# Patient Record
Sex: Male | Born: 1980 | Race: Black or African American | Hispanic: No | Marital: Single | Smoking: Never smoker
Health system: Southern US, Community
[De-identification: ages and names within clinical notes are randomized; demographics above are authoritative.]

## PROBLEM LIST (undated history)

## (undated) DIAGNOSIS — R55 Syncope and collapse: Secondary | ICD-10-CM

## (undated) DIAGNOSIS — J45909 Unspecified asthma, uncomplicated: Secondary | ICD-10-CM

## (undated) HISTORY — PX: NO PAST SURGERIES: SHX2092

## (undated) HISTORY — DX: Syncope and collapse: R55

---

## 2019-02-09 ENCOUNTER — Emergency Department (HOSPITAL_COMMUNITY)
Admission: EM | Admit: 2019-02-09 | Discharge: 2019-02-09 | Disposition: A | Discharge status: Home / Self Care | Payer: Self-pay | Attending: Emergency Medicine | Admitting: Emergency Medicine

## 2019-02-09 ENCOUNTER — Emergency Department (HOSPITAL_COMMUNITY): Payer: Self-pay

## 2019-02-09 ENCOUNTER — Encounter (HOSPITAL_COMMUNITY): Payer: Self-pay | Admitting: *Deleted

## 2019-02-09 DIAGNOSIS — J45909 Unspecified asthma, uncomplicated: Secondary | ICD-10-CM | POA: Insufficient documentation

## 2019-02-09 DIAGNOSIS — R0789 Other chest pain: Secondary | ICD-10-CM | POA: Insufficient documentation

## 2019-02-09 HISTORY — DX: Unspecified asthma, uncomplicated: J45.909

## 2019-02-09 LAB — BASIC METABOLIC PANEL
ANION GAP: 9 (ref 5–15)
BUN: 14 mg/dL (ref 6–20)
CO2: 27 mmol/L (ref 22–32)
Calcium: 9.5 mg/dL (ref 8.9–10.3)
Chloride: 107 mmol/L (ref 98–111)
Creatinine, Ser: 1.09 mg/dL (ref 0.61–1.24)
GFR calc non Af Amer: >60 mL/min (ref >60)
Glucose, Bld: 88 mg/dL (ref 70–99)
Potassium: 4.2 mmol/L (ref 3.5–5.1)
Sodium: 143 mmol/L (ref 135–145)

## 2019-02-09 LAB — CBC
HCT: 49.6 % (ref 39.0–52.0)
Hemoglobin: 15 g/dL (ref 13.0–17.0)
MCH: 27.3 pg (ref 26.0–34.0)
MCHC: 30.2 g/dL (ref 30.0–36.0)
MCV: 90.3 fL (ref 80.0–100.0)
Platelets: 254 10*3/uL (ref 150–400)
RBC: 5.49 MIL/uL (ref 4.22–5.81)
RDW: 13 % (ref 11.5–15.5)
WBC: 8.3 10*3/uL (ref 4.0–10.5)
nRBC: 0 % (ref 0.0–0.2)

## 2019-02-09 MED ORDER — NAPROXEN 500 MG PO TABS: 500.0000 mg | ORAL_TABLET | Freq: Two times a day (BID) | ORAL | 0 refills | Status: AC

## 2019-02-09 MED ORDER — SODIUM CHLORIDE 0.9% FLUSH: 3.0000 mL | Freq: Once | INTRAVENOUS | Status: DC

## 2019-02-09 NOTE — ED Triage Notes (Signed)
Pt in c/o shortness of breath, pt states he has a history of asthma, reports his chest feels tight when he is trying to take a deep breath, started this afternoon, denies cough

## 2019-02-09 NOTE — Discharge Instructions (Addendum)
You were evaluated in the Emergency Department and after careful evaluation, we did not find any emergent condition requiring admission or further testing in the hospital.  Your symptoms today seem to be due to sprain or spasm of the muscles between the ribs.  Please use the anti-inflammatory as directed and try to rest these muscles with light duty for a few days.  Please return to the Emergency Department if you experience any worsening of your condition.  We encourage you to follow up with a primary care provider.  Thank you for allowing [place] to be a part of your care.

## 2019-02-09 NOTE — ED Provider Notes (Signed)
[facility] Emergency Department Provider Note MRN:  [id number]  Arrival date & time: 02/09/19     Chief Complaint   Shortness of Breath and Chest Pain   History of Present Illness   Jacob Olsen is a 38 y.o. year-old male with no pertinent past medical history presenting to the ED with chief complaint of chest pain.  At about 3 PM today, while at work, patient experienced sudden onset sharp left-sided chest pain.  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  Pain has been worse with certain positions today.  Denies any associated symptoms such as dizziness, no diaphoresis, no nausea, no vomiting.  Pain is worse with deep breaths.  Pain is worse with palpation.  Pain is moderate.  Review of Systems  A complete 10 system review of systems was obtained and all systems are negative except as noted in the HPI and PMH.   Patient's Health History    Past Medical History:  Diagnosis Date  . Asthma     History reviewed. No pertinent surgical history.  History reviewed. No pertinent family history.  Social History   Socioeconomic History  . Marital status: Single    Spouse name: Not on file  . Number of children: Not on file  . Years of education: Not on file  . Highest education level: Not on file  Occupational History  . Not on file  Social Needs  . Financial resource strain: Not on file  . Food insecurity:    Worry: Not on file    Inability: Not on file  . Transportation needs:    Medical: Not on file    Non-medical: Not on file  Tobacco Use  . Smoking status: Never Smoker  . Smokeless tobacco: Never Used  Substance and Sexual Activity  . Alcohol use: Not on file  . Drug use: Not on file  . Sexual activity: Not on file  Lifestyle  . Physical activity:    Days per week: Not on file    Minutes per session: Not on file  . Stress: Not on file    Relationships  . Social connections:    Talks on phone: Not on file    Gets together: Not on file    Attends religious service: Not on file    Active member of club or organization: Not on file    Attends meetings of clubs or organizations: Not on file    Relationship status: Not on file  . Intimate partner violence:    Fear of current or ex partner: Not on file    Emotionally abused: Not on file    Physically abused: Not on file    Forced sexual activity: Not on file  Other Topics Concern  . Not on file  Social History Narrative  . Not on file     Physical Exam  Vital Signs and Nursing Notes reviewed Vitals:   02/09/19 1700 02/09/19 2023  BP: 124/78 118/66  Pulse: 93 80  Resp: 20 14  Temp: 98.3 F (36.8 C)   SpO2: 99% 99%    CONSTITUTIONAL: Well-appearing, NAD NEURO:  Alert and oriented x 3, no focal deficits EYES:  eyes equal and reactive ENT/NECK:  no LAD, no JVD CARDIO: Regular rate, well-perfused, normal S1 and S2; focal tenderness to palpation to the ribs beneath the left pectoral muscle PULM:  CTAB no wheezing or rhonchi GI/GU:  normal bowel sounds, non-distended, non-tender MSK/SPINE:  No gross deformities, no edema SKIN:  no rash, atraumatic PSYCH:  Appropriate speech and behavior  Diagnostic and Interventional Summary    EKG Interpretation  Date/Time:  Wednesday February 09 2019 16:55:45 EST Ventricular Rate:  98 PR Interval:  182 QRS Duration: 72 QT Interval:  332 QTC Calculation: 423 R Axis:   17 Text Interpretation:  Normal sinus rhythm with sinus arrhythmia Normal ECG Confirmed by Kennis Carina [phone]) on 02/09/2019 9:32:24 PM      Labs Reviewed  BASIC METABOLIC PANEL  CBC    DG Chest 2 View  Final Result      Medications  sodium chloride flush (NS) 0.9 % injection 3 mL (has no administration in time range)     Procedures Critical Care  ED Course and Medical Decision Making  I have reviewed the triage vital signs and the nursing  notes.  Pertinent labs & imaging results that were available during my care of the patient were reviewed by me and considered in my medical decision making (see below for details).  Consistent with musculoskeletal chest pain, PERC negative, no cardiac risk factors, EKG unremarkable, reproducible on exam.  Appropriate for discharge with NSAIDs.  After the discussed management above, the patient was determined to be safe for discharge.  The patient was in agreement with this plan and all questions regarding their care were answered.  ED return precautions were discussed and the patient will return to the ED with any significant worsening of condition.  Elmer Sow. Pilar Plate, MD [facility] Emergency Medicine [facility] [email or web address]  Final Clinical Impressions(s) / ED Diagnoses     ICD-10-CM   1. Chest wall pain R07.89     ED Discharge Orders         Ordered    naproxen (NAPROSYN) 500 MG tablet  2 times daily     02/09/19 2149             Sabas Sous, MD 02/09/19 2150

## 2019-02-09 NOTE — ED Notes (Signed)
Patient verbalizes understanding of discharge instructions. Opportunity for questioning and answers were provided. Armband removed by staff, pt discharged from ED.  

## 2019-03-18 ENCOUNTER — Other Ambulatory Visit: Payer: Self-pay

## 2019-03-18 ENCOUNTER — Encounter (HOSPITAL_COMMUNITY): Payer: Self-pay | Admitting: Emergency Medicine

## 2019-03-18 ENCOUNTER — Emergency Department (HOSPITAL_COMMUNITY)
Admission: EM | Admit: 2019-03-18 | Discharge: 2019-03-18 | Disposition: A | Discharge status: Home / Self Care | Payer: Self-pay | Attending: Emergency Medicine | Admitting: Emergency Medicine

## 2019-03-18 DIAGNOSIS — Z79899 Other long term (current) drug therapy: Secondary | ICD-10-CM | POA: Insufficient documentation

## 2019-03-18 DIAGNOSIS — R55 Syncope and collapse: Secondary | ICD-10-CM | POA: Insufficient documentation

## 2019-03-18 DIAGNOSIS — J45909 Unspecified asthma, uncomplicated: Secondary | ICD-10-CM | POA: Insufficient documentation

## 2019-03-18 LAB — CBC
HCT: 42.9 % (ref 39.0–52.0)
Hemoglobin: 13.8 g/dL (ref 13.0–17.0)
MCH: 28.9 pg (ref 26.0–34.0)
MCHC: 32.2 g/dL (ref 30.0–36.0)
MCV: 89.7 fL (ref 80.0–100.0)
Platelets: 212 10*3/uL (ref 150–400)
RBC: 4.78 MIL/uL (ref 4.22–5.81)
RDW: 13.2 % (ref 11.5–15.5)
WBC: 8.2 10*3/uL (ref 4.0–10.5)
nRBC: 0 % (ref 0.0–0.2)

## 2019-03-18 LAB — URINALYSIS, ROUTINE W REFLEX MICROSCOPIC
Bilirubin Urine: NEGATIVE
Glucose, UA: NEGATIVE mg/dL
HGB URINE DIPSTICK: NEGATIVE
Ketones, ur: NEGATIVE mg/dL
Leukocytes,Ua: NEGATIVE
Nitrite: NEGATIVE
Protein, ur: NEGATIVE mg/dL
Specific Gravity, Urine: 1.015 (ref 1.005–1.030)
pH: 6 (ref 5.0–8.0)

## 2019-03-18 LAB — BASIC METABOLIC PANEL
Anion gap: 8 (ref 5–15)
BUN: 13 mg/dL (ref 6–20)
CO2: 23 mmol/L (ref 22–32)
Calcium: 8.6 mg/dL — ABNORMAL LOW (ref 8.9–10.3)
Chloride: 108 mmol/L (ref 98–111)
Creatinine, Ser: 1.09 mg/dL (ref 0.61–1.24)
GFR calc Af Amer: >60 mL/min (ref >60)
GFR calc non Af Amer: >60 mL/min (ref >60)
Glucose, Bld: 87 mg/dL (ref 70–99)
Potassium: 4.9 mmol/L (ref 3.5–5.1)
Sodium: 139 mmol/L (ref 135–145)

## 2019-03-18 LAB — CBG MONITORING, ED: Glucose-Capillary: 84 mg/dL (ref 70–99)

## 2019-03-18 MED ORDER — SODIUM CHLORIDE 0.9 % IV BOLUS
1000.0000 mL | Freq: Once | INTRAVENOUS | Status: AC
  Administered 2019-03-18: 1000 mL via INTRAVENOUS

## 2019-03-18 NOTE — Discharge Instructions (Signed)
Please follow up with heart specialist for further evaluation of your passing out spell if it reoccur.  Stay hydrated, drink plenty of fluid.
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
## 2019-03-18 NOTE — ED Notes (Signed)
Bed: WA15 Expected date:  Expected time:  Means of arrival:  Comments: EMS syncope 
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
## 2019-03-18 NOTE — ED Notes (Signed)
Patient given discharge teaching and verbalized understanding. Patient ambulated out of ED with a steady gait. 

## 2019-04-12 ENCOUNTER — Telehealth: Payer: Self-pay | Admitting: Cardiovascular Disease

## 2019-04-12 DIAGNOSIS — R55 Syncope and collapse: Secondary | ICD-10-CM | POA: Insufficient documentation

## 2019-04-12 NOTE — Telephone Encounter (Signed)
Spoke with Production designer, theatre/television/film, Mr. Manson Passey, of home patient living in at the time. He consented for patient to have phone visit. Patient does not have access to computer or smart phone at this time.     Virtual Visit Pre-Appointment Phone Call  Steps For Call:  1. Confirm consent - "In the setting of the current Covid19 crisis, you are scheduled for a (phone or video) visit with your provider on (date) at (time).  Just as we do with many in-office visits, in order for you to participate in this visit, we must obtain consent.  If you'd like, I can send this to your mychart (if signed up) or email for you to review.  Otherwise, I can obtain your verbal consent now.  All virtual visits are billed to your insurance company just like a normal visit would be.  By agreeing to a virtual visit, we'd like you to understand that the technology does not allow for your provider to perform an examination, and thus may limit your provider's ability to fully assess your condition.  Finally, though the technology is pretty good, we cannot assure that it will always work on either your or our end, and in the setting of a video visit, we may have to convert it to a phone-only visit.  In either situation, we cannot ensure that we have a secure connection.  Are you willing to proceed?" STAFF: Did the patient verbally acknowledge consent to treatment? Yes  2. Give patient instructions for WebEx/MyChart download to smartphone or Doximity/Doxy.me if video visit (depending on what platform provider is using)  3. Advise patient to be prepared with any vital sign or heart rhythm information, their current medicines, and a piece of paper and pen handy for any instructions they may receive the day of their visit  4. Inform patient they will receive a phone call 15 minutes prior to their appointment time (may be from unknown caller ID) so they should be prepared to answer  5. Confirm that appointment type is correct in Epic appointment  notes (video vs telephone)    TELEPHONE CALL NOTE  CAITLYN KLECKNER has been deemed a candidate for a follow-up tele-health visit to limit community exposure during the Covid-19 pandemic. I spoke with the patient via phone to ensure availability of phone/video source, confirm preferred email & phone number, and discuss instructions and expectations.  I reminded [place] to be prepared with any vital sign and/or heart rhythm information that could potentially be obtained via home monitoring, at the time of his visit. I reminded [place] to expect a phone call at the time of his visit if his visit.  Ethelda Chick, RN 04/12/2019 11:02 AM   DOWNLOADING THE WEBEX SOFTWARE TO SMARTPHONE  - If Apple, go to [employer] and type in WebEx in the search bar. Download [employer], the blue/green circle. The app is free but as with any other app downloads, their phone may require them to verify saved payment information or Apple password. The patient does NOT have to create an account.  - If Android, ask patient to go to [employer] and type in WebEx in the search bar. Download [employer], the blue/green circle. The app is free but as with any other app downloads, their phone may require them to verify saved payment information or Android password. The patient does NOT have to create an account.   CONSENT FOR TELE-HEALTH VISIT - PLEASE REVIEW  I  hereby voluntarily request, consent and authorize [facility] and its employed or contracted physicians, physician assistants, nurse practitioners or other licensed health care professionals (the Practitioner), to provide me with telemedicine health care services (the "Services") as deemed necessary by the treating Practitioner. I acknowledge and consent to receive the Services by the Practitioner via telemedicine. I understand that the telemedicine visit will involve communicating with the Practitioner through live  audiovisual communication technology and the disclosure of certain medical information by electronic transmission. I acknowledge that I have been given the opportunity to request an in-person assessment or other available alternative prior to the telemedicine visit and am voluntarily participating in the telemedicine visit.  I understand that I have the right to withhold or withdraw my consent to the use of telemedicine in the course of my care at any time, without affecting my right to future care or treatment, and that the Practitioner or I may terminate the telemedicine visit at any time. I understand that I have the right to inspect all information obtained and/or recorded in the course of the telemedicine visit and may receive copies of available information for a reasonable fee.  I understand that some of the potential risks of receiving the Services via telemedicine include:  Marland Kitchen. Delay or interruption in medical evaluation due to technological equipment failure or disruption; . Information transmitted may not be sufficient (e.g. poor resolution of images) to allow for appropriate medical decision making by the Practitioner; and/or  . In rare instances, security protocols could fail, causing a breach of personal health information.  Furthermore, I acknowledge that it is my responsibility to provide information about my medical history, conditions and care that is complete and accurate to the best of my ability. I acknowledge that Practitioner's advice, recommendations, and/or decision may be based on factors not within their control, such as incomplete or inaccurate data provided by me or distortions of diagnostic images or specimens that may result from electronic transmissions. I understand that the practice of medicine is not an exact science and that Practitioner makes no warranties or guarantees regarding treatment outcomes. I acknowledge that I will receive a copy of this consent concurrently upon  execution via email to the email address I last provided but may also request a printed copy by calling the office of [facility].    I understand that my insurance will be billed for this visit.   I have read or had this consent read to me. . I understand the contents of this consent, which adequately explains the benefits and risks of the Services being provided via telemedicine.  . I have been provided ample opportunity to ask questions regarding this consent and the Services and have had my questions answered to my satisfaction. . I give my informed consent for the services to be provided through the use of telemedicine in my medical care  By participating in this telemedicine visit I agree to the above.

## 2019-04-12 NOTE — Progress Notes (Signed)
Physician History and Physical     Patient ID: Jacob Olsen MRN: [id number] DOB/AGE: 36/08/1981 36 y.o. Admit date: (Not on file)  Primary Care Physician: Patient, No Pcp Per Primary Cardiologist: /New  Active Problems:   * No active hospital problems. *   Tele- Visit Patient location Home Physician location [place] [facility]  Due to COVID restrictions   HPI:  38 [facility]. [facility] Fayrene Helperran Bowie PA- c for syncope. Reviewed ER note from March 20. 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 No chest pain , dyspnea palpitations No high risk family history In ER r/o , no arrhythmia CXR normal labs ok Given fluid and d/c ? Vasovagal episode ECG normal including QT no signs of Brugada or HOCM He denies excess ETOH, drugs . He has had a few episodes of unexplained LOC in the past year. No history of head trauma, or seizures. Takes no meds routinely PRN naproxen for pain  [facility] [place] 3 kids and [place] Thinks episode was from low sugar and heat had sweater on washing cars and hadn't   COVID screen negative no dyspnea, fever cough or contacts with positive patients   Review of systems complete and found to be negative unless listed above   Past Medical History:  Diagnosis Date  . Asthma   . Syncope and collapse     No family history on file.  Social History   Socioeconomic History  . Marital status: Single    Spouse name: Not on file  . Number of children: Not on file  . Years of education: Not on file  . Highest education level: Not on file  Occupational History  . Not on file  Social Needs  . Financial resource strain: Not on file  . Food insecurity:    Worry: Not on file    Inability: Not on file  . Transportation needs:    Medical: Not on file    Non-medical: Not on file  Tobacco Use  . Smoking status: Never Smoker  . Smokeless tobacco: Never Used  Substance and Sexual Activity  . Alcohol use: Not Currently  .  Drug use: Never  . Sexual activity: Not on file  Lifestyle  . Physical activity:    Days per week: Not on file    Minutes per session: Not on file  . Stress: Not on file  Relationships  . Social connections:    Talks on phone: Not on file    Gets together: Not on file    Attends religious service: Not on file    Active member of club or organization: Not on file    Attends meetings of clubs or organizations: Not on file    Relationship status: Not on file  . Intimate partner violence:    Fear of current or ex partner: Not on file    Emotionally abused: Not on file    Physically abused: Not on file    Forced sexual activity: Not on file  Other Topics Concern  . Not on file  Social History Narrative  . Not on file    No past surgical history on file.   (Not in a hospital admission)   Physical Exam: Height 6\' 1"  (1.854 m), weight 131.5 kg.    130/70 60 afebrile No JVP elevation No tachypnea No edema Skin warm and dry   Current Outpatient Medications on File Prior to Visit  Medication Sig  Dispense Refill  . naproxen (NAPROSYN) 500 MG tablet Take 1 tablet (500 mg total) by mouth 2 (two) times daily. 30 tablet 0   No current facility-administered medications on file prior to visit.     Labs:   Lab Results  Component Value Date   WBC 8.2 03/18/2019   HGB 13.8 03/18/2019   HCT 42.9 03/18/2019   MCV 89.7 03/18/2019   PLT 212 03/18/2019   No results for input(s): NA, K, CL, CO2, BUN, CREATININE, CALCIUM, PROT, BILITOT, ALKPHOS, ALT, AST, GLUCOSE in the last 168 hours.  Invalid input(s): LABALBU No results found for: CKTOTAL, CKMB, CKMBINDEX, TROPONINI  No results found for: CHOL No results found for: HDL No results found for: LDLCALC No results found for: TRIG No results found for: CHOLHDL No results found for: LDLDIRECT     Radiology: No results found.  EKG: NSR normal ECG 03/18/19  ASSESSMENT AND PLAN:  Syncope:  Doubt cardiac in etiology No high risk  family history , normal exam and ECG. Will have him get echo to r/o structural heart disease and ETT to r/o abnormal hemodynamic response to exercise and arrhythmia Primary should arrange f/u with neurology and consider head CT   He understands testing may be delayed due to COVID 19 restrictions    Signed: Theron Arista Nishan4/16/2020, 8:30 AM

## 2019-04-12 NOTE — Telephone Encounter (Signed)
New Message          Patient is coming in on Thursday for Dr. Eden Emms, need records from hospital. [id number]. DOB September 13, 1981 Thx.

## 2019-04-14 ENCOUNTER — Other Ambulatory Visit: Payer: Self-pay

## 2019-04-14 ENCOUNTER — Telehealth (INDEPENDENT_AMBULATORY_CARE_PROVIDER_SITE_OTHER): Payer: Self-pay | Admitting: Cardiovascular Disease

## 2019-04-14 ENCOUNTER — Encounter: Payer: Self-pay | Admitting: Cardiovascular Disease

## 2019-04-14 VITALS — Ht 73.0 in | Wt 290.0 lb

## 2019-04-14 DIAGNOSIS — R55 Syncope and collapse: Secondary | ICD-10-CM

## 2019-04-14 NOTE — Patient Instructions (Addendum)
Medication Instructions:   If you need a refill on your cardiac medications before your next appointment, please call your pharmacy.   Lab work:  If you have labs (blood work) drawn today and your tests are completely normal, you will receive your results only by: . MyChart Message (if you have MyChart) OR . A paper copy in the mail If you have any lab test that is abnormal or we need to change your treatment, we will call you to review the results.  Testing/Procedures: Your physician has requested that you have an echocardiogram. Echocardiography is a painless test that uses sound waves to create images of your heart. It provides your doctor with information about the size and shape of your heart and how well your heart's chambers and valves are working. This procedure takes approximately one hour. There are no restrictions for this procedure.  Your physician has requested that you have an exercise tolerance test. For further information please visit [email or web address]. Please also follow instruction sheet, as given.  Follow-Up: At [facility], you and your health needs are our priority.  As part of our continuing mission to provide you with exceptional heart care, we have created designated Provider Care Teams.  These Care Teams include your primary Cardiologist (physician) and Advanced Practice Providers (APPs -  Physician Assistants and Nurse Practitioners) who all work together to provide you with the care you need, when you need it. . You will need a follow up appointment as needed with Dr. Nishan.    

## 2019-04-15 ENCOUNTER — Ambulatory Visit: Payer: Self-pay | Admitting: Cardiovascular Disease

## 2019-04-23 ENCOUNTER — Other Ambulatory Visit: Payer: Self-pay

## 2019-04-23 ENCOUNTER — Encounter (HOSPITAL_COMMUNITY): Payer: Self-pay | Admitting: *Deleted

## 2019-04-23 ENCOUNTER — Emergency Department (HOSPITAL_COMMUNITY): Payer: Self-pay

## 2019-04-23 ENCOUNTER — Emergency Department (HOSPITAL_COMMUNITY)
Admission: EM | Admit: 2019-04-23 | Discharge: 2019-04-23 | Disposition: A | Discharge status: Home / Self Care | Payer: Self-pay | Attending: Emergency Medicine | Admitting: Emergency Medicine

## 2019-04-23 DIAGNOSIS — Z20822 Contact with and (suspected) exposure to covid-19: Secondary | ICD-10-CM

## 2019-04-23 DIAGNOSIS — R5383 Other fatigue: Secondary | ICD-10-CM | POA: Insufficient documentation

## 2019-04-23 DIAGNOSIS — R6889 Other general symptoms and signs: Secondary | ICD-10-CM

## 2019-04-23 DIAGNOSIS — R0981 Nasal congestion: Secondary | ICD-10-CM | POA: Insufficient documentation

## 2019-04-23 DIAGNOSIS — M7918 Myalgia, other site: Secondary | ICD-10-CM | POA: Insufficient documentation

## 2019-04-23 DIAGNOSIS — Z8709 Personal history of other diseases of the respiratory system: Secondary | ICD-10-CM | POA: Insufficient documentation

## 2019-04-23 DIAGNOSIS — R51 Headache: Secondary | ICD-10-CM | POA: Insufficient documentation

## 2019-04-23 DIAGNOSIS — R05 Cough: Secondary | ICD-10-CM | POA: Insufficient documentation

## 2019-04-23 DIAGNOSIS — R519 Headache, unspecified: Secondary | ICD-10-CM

## 2019-04-23 DIAGNOSIS — H538 Other visual disturbances: Secondary | ICD-10-CM | POA: Insufficient documentation

## 2019-04-23 DIAGNOSIS — J189 Pneumonia, unspecified organism: Secondary | ICD-10-CM | POA: Insufficient documentation

## 2019-04-23 MED ORDER — ACETAMINOPHEN 325 MG PO TABS
650.0000 mg | ORAL_TABLET | Freq: Once | ORAL | Status: AC
  Administered 2019-04-23: 650 mg via ORAL
  Filled 2019-04-23: qty 2

## 2019-04-23 MED ORDER — ALBUTEROL SULFATE HFA 108 (90 BASE) MCG/ACT IN AERS
1.0000 | INHALATION_SPRAY | RESPIRATORY_TRACT | Status: DC | PRN
  Administered 2019-04-23: 2 via RESPIRATORY_TRACT
  Filled 2019-04-23: qty 6.7

## 2019-04-23 MED ORDER — SODIUM CHLORIDE 0.9 % IV BOLUS
1000.0000 mL | Freq: Once | INTRAVENOUS | Status: AC
  Administered 2019-04-23: 1000 mL via INTRAVENOUS

## 2019-04-23 MED ORDER — DOXYCYCLINE HYCLATE 100 MG PO CAPS: 100.0000 mg | ORAL_CAPSULE | Freq: Two times a day (BID) | ORAL | 0 refills | Status: AC

## 2019-04-23 MED ORDER — AEROCHAMBER PLUS FLO-VU LARGE MISC
1.0000 | Freq: Once | Status: AC
  Administered 2019-04-23: 10:00:00 1

## 2019-04-23 NOTE — ED Triage Notes (Signed)
PT reports upon waking this morning he had a HA , muscle fatigue and was weezing.

## 2019-04-23 NOTE — Discharge Instructions (Addendum)
You have been diagnosed today with suspected COVID-19 virus infection, community-acquired pneumonia of both lungs, headache.  At this time there does not appear to be the presence of an emergent medical condition, however there is always the potential for conditions to change. Please read and follow the below instructions.  Please return to the Emergency Department immediately for any new or worsening symptoms. Please be sure to follow up with your Primary Care Provider within one week regarding your visit today; please call their office to schedule an appointment even if you are feeling better for a follow-up visit. You may use the albuterol inhaler and spacer given to you today to help with your symptoms.  Return to the emergency department if you feel that the albuterol is not working or that you are using it too often. Please take the antibiotic medication doxycycline as prescribed to help treat your pneumonia.  Take this medication twice daily for the next 7 days. It is likely that dehydration has contributed to your headache.  Please drink plenty of water to avoid dehydration and get plenty of rest.  Return to emergency department immediately for any new or worsening symptoms including neck pain/stiffness. Please continue to self quarantine for the next 2 weeks to avoid potential spread of virus.  Return to emergency department immediately for any new or worsening symptoms\  Get help right away if: You are short of breath and it gets worse. You have chest pain. You cough up blood. Your headache gets very bad quickly. You throw up. You have a stiff neck or neck pain You have trouble seeing. You have trouble speaking. You have pain in the eye or ear. Your muscles are weak or you lose muscle control. You lose your balance or have trouble walking. You feel like you will pass out (faint) or you pass out. You are mixed up (confused). You have a seizure. Any new/concerning or worsening  symptoms  Please read the additional information packets attached to your discharge summary.  Do not take your medicine if  develop an itchy rash, swelling in your mouth or lips, or difficulty breathing.  =============== If you live with, or provide care at home for, a person confirmed to have, or being evaluated for, COVID-19 infection please follow these guidelines to prevent infection:  Follow healthcare providers instructions Make sure that you understand and can help the patient follow any healthcare provider instructions for all care.  Provide for the patients basic needs You should help the patient with basic needs in the home and provide support for getting groceries, prescriptions, and other personal needs.  Monitor the patients symptoms If they are getting sicker, call his or her medical provider a  This will help the healthcare providers office take steps to keep other people from getting infected. Ask the healthcare provider to call the local or state health department.  Limit the number of people who have contact with the patient If possible, have only one caregiver for the patient. Other household members should stay in another home or place of residence. If this is not possible, they should stay in another room, or be separated from the patient as much as possible. Use a separate bathroom, if available. Restrict visitors who do not have an essential need to be in the home.  Keep older adults, very young children, and other sick people away from the patient Keep older adults, very young children, and those who have compromised immune systems or chronic health conditions away from  the patient. This includes people with chronic heart, lung, or kidney conditions, diabetes, and cancer.  Ensure good ventilation Make sure that shared spaces in the home have good air flow, such as from an air conditioner or an opened window, weather permitting.  Wash your hands  often Wash your hands often and thoroughly with soap and water for at least 20 seconds. You can use an alcohol based hand sanitizer if soap and water are not available and if your hands are not visibly dirty. Avoid touching your eyes, nose, and mouth with unwashed hands. Use disposable paper towels to dry your hands. If not available, use dedicated cloth towels and replace them when they become wet.  Wear a facemask and gloves Wear a disposable facemask at all times in the room and gloves when you touch or have contact with the patients blood, body fluids, and/or secretions or excretions, such as sweat, saliva, sputum, nasal mucus, vomit, urine, or feces.  Ensure the mask fits over your nose and mouth tightly, and do not touch it during use. Throw out disposable facemasks and gloves after using them. Do not reuse. Wash your hands immediately after removing your facemask and gloves. If your personal clothing becomes contaminated, carefully remove clothing and launder. Wash your hands after handling contaminated clothing. Place all used disposable facemasks, gloves, and other waste in a lined container before disposing them with other household waste. Remove gloves and wash your hands immediately after handling these items.  Do not share dishes, glasses, or other household items with the patient Avoid sharing household items. You should not share dishes, drinking glasses, cups, eating utensils, towels, bedding, or other items After the person uses these items, you should wash them thoroughly with soap and water.  Wash laundry thoroughly Immediately remove and wash clothes or bedding that have blood, body fluids, and/or secretions or excretions, such as sweat, saliva, sputum, nasal mucus, vomit, urine, or feces, on them. Wear gloves when handling laundry from the patient. Read and follow directions on labels of laundry or clothing items and detergent. In general, wash and dry with the warmest  temperatures recommended on the label.  Clean all areas the individual has used often Clean all touchable surfaces, such as counters, tabletops, doorknobs, bathroom fixtures, toilets, phones, keyboards, tablets, and bedside tables, every day. Also, clean any surfaces that may have blood, body fluids, and/or secretions or excretions on them. Wear gloves when cleaning surfaces the patient has come in contact with. Use a diluted bleach solution (e.g., dilute bleach with 1 part bleach and 10 parts water) or a household disinfectant with a label that says EPA-registered for coronaviruses. To make a bleach solution at home, add 1 tablespoon of bleach to 1 quart (4 cups) of water. For a larger supply, add  cup of bleach to 1 gallon (16 cups) of water. Read labels of cleaning products and follow recommendations provided on product labels. Labels contain instructions for safe and effective use of the cleaning product including precautions you should take when applying the product, such as wearing gloves or eye protection and making sure you have good ventilation during use of the product. Remove gloves and wash hands immediately after cleaning.  Monitor yourself for signs and symptoms of illness Caregivers and household members are considered close contacts, should monitor their health, and will be asked to limit movement outside of the home to the extent possible. Follow the monitoring steps for close contacts listed on the symptom monitoring form.   ?  If you have additional questions, contact your local health department or call the epidemiologist on call at [phone] (available 24/7). ? This guidance is subject to change. For the most up-to-date guidance from [facility], please refer to their website: TripMetro.huhttps://www.cdc.gov/coronavirus/2019-ncov/hcp/guidance-prevent-spread.html

## 2019-04-23 NOTE — ED Notes (Signed)
Patient transported to CT 

## 2019-04-23 NOTE — ED Provider Notes (Signed)
[facility] EMERGENCY DEPARTMENT Provider Note   CSN: [id number] Arrival date & time: 04/23/19  0740    History   Chief Complaint Chief Complaint  Patient presents with   Headache    HPI Jacob Olsen is a 38 y.o. male with history of asthma presenting today for viral illness.  Patient reports that over the past 2 days he has had subjective fever, productive cough, generalized body aches, fatigue and headache.  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  He has not taken any medications or antipyretics prior to arrival.  Patient reports that all of his symptoms have been worsening since onset 2 days ago.  He describes a cough productive with small amounts of white/yellow sputum.  He describes his headache as a frontal headache constant throbbing mild without aggravating or alleviating factors.  Patient reports that he has had headaches in the past similar in nature to this headache he describes a gradual onset of his headache and denies any history of trauma, sudden onset, pain around the temporal arteries, numbness/weakness or tingling of the extremities or neck pain/stiffness.  Patient does state that he will occasionally see small spots in his vision however these are brief and resolve on their own.  Of note patient was seen on 03/18/2019 for syncope and collapse while at work.  He has followed up via telemedicine with cardiology on 04/14/2019 and they plan to perform stress test and echo for further evaluation.  Patient denies any further syncope or collapse since 03/18/2019.  Further chart review reveals that cardiology was deferring CT head to neurology for further evaluation but patient reports inability to follow-up to receive head CT due to current pandemic.    HPI  Past Medical History:  Diagnosis Date   Asthma    Syncope and collapse     Patient Active Problem List   Diagnosis Date Noted   Syncope and  collapse 04/12/2019    History reviewed. No pertinent surgical history.      Home Medications    Prior to Admission medications   Medication Sig Start Date End Date Taking? Authorizing Provider  doxycycline (VIBRAMYCIN) 100 MG capsule Take 1 capsule (100 mg total) by mouth 2 (two) times daily for 7 days. 04/23/19 04/30/19  Harlene Salts A, PA-C  naproxen (NAPROSYN) 500 MG tablet Take 1 tablet (500 mg total) by mouth 2 (two) times daily. 02/09/19   Sabas Sous, MD    Family History History reviewed. No pertinent family history.  Social History Social History   Tobacco Use   Smoking status: Never Smoker   Smokeless tobacco: Never Used  Substance Use Topics   Alcohol use: Not Currently   Drug use: Never     Allergies   Patient has no known allergies.   Review of Systems Review of Systems  Constitutional: Positive for chills, fatigue and fever (Subjective).  HENT: Positive for congestion and rhinorrhea. Negative for sore throat, trouble swallowing and voice change.   Eyes: Positive for visual disturbance (Occasional seeing spots). Negative for photophobia and pain.  Respiratory: Positive for cough and shortness of breath.   Cardiovascular: Negative.  Negative for chest pain and leg swelling.  Gastrointestinal: Negative.  Negative for abdominal pain, diarrhea, nausea and vomiting.  Musculoskeletal: Positive for arthralgias and myalgias. Negative for neck stiffness.  Neurological: Positive for headaches. Negative for dizziness, syncope, weakness and numbness.  All other systems reviewed and are negative.  Physical  Exam Updated Vital Signs BP 134/78 (BP Location: Right Wrist)    Pulse 100    Temp 99 F (37.2 C) (Oral)    Resp 16    Ht  (1.854 m)    Wt 131.9 kg    SpO2 100%    BMI 38.36 kg/m   Physical Exam Constitutional:      General: He is not in acute distress.    Appearance: Normal appearance. He is well-developed. He is obese. He is not ill-appearing  or diaphoretic.  HENT:     Head: Normocephalic and atraumatic. No raccoon eyes, Battle's sign, abrasion or contusion.     Jaw: There is normal jaw occlusion. No trismus.     Right Ear: Tympanic membrane, ear canal and external ear normal. No hemotympanum.     Left Ear: Tympanic membrane, ear canal and external ear normal. No hemotympanum.     Ears:     Comments: Hearing grossly intact bilaterally    Nose: Nose normal. No rhinorrhea.     Right Nostril: No epistaxis.     Left Nostril: No epistaxis.     Mouth/Throat:     Lips: Pink.     Mouth: Mucous membranes are moist.     Pharynx: Oropharynx is clear. Uvula midline.  Eyes:     General: Vision grossly intact. Gaze aligned appropriately.     Extraocular Movements: Extraocular movements intact.     Conjunctiva/sclera: Conjunctivae normal.     Pupils: Pupils are equal, round, and reactive to light.     Comments: Visual fields grossly intact bilaterally  Neck:     Musculoskeletal: Full passive range of motion without pain, normal range of motion and neck supple. No neck rigidity.     Trachea: Trachea and phonation normal. No tracheal tenderness or tracheal deviation.     Meningeal: Brudzinski's sign absent.  Cardiovascular:     Rate and Rhythm: Normal rate and regular rhythm.     Pulses:          Dorsalis pedis pulses are 2+ on the right side and 2+ on the left side.       Posterior tibial pulses are 2+ on the right side and 2+ on the left side.     Heart sounds: Normal heart sounds.  Pulmonary:     Effort: Pulmonary effort is normal. No accessory muscle usage or respiratory distress.     Breath sounds: Normal air entry. Wheezing (Faint bilateral expiratory wheezing) present.  Chest:     Chest wall: No tenderness.  Abdominal:     General: Bowel sounds are normal. There is no distension.     Palpations: Abdomen is soft.     Tenderness: There is no abdominal tenderness. There is no guarding or rebound.  Musculoskeletal:     Right  lower leg: Normal. He exhibits no tenderness. No edema.     Left lower leg: Normal. He exhibits no tenderness. No edema.     Comments: No midline C/T/L spinal tenderness to palpation, no paraspinal muscle tenderness, no deformity, crepitus, or step-off noted. No sign of injury to the neck or back.  Feet:     Right foot:     Protective Sensation: 5 sites tested. 5 sites sensed.     Left foot:     Protective Sensation: 5 sites tested. 5 sites sensed.  Skin:    General: Skin is warm and dry.  Neurological:     Mental Status: He is alert and oriented to person,  place, and time.     GCS: GCS eye subscore is 4. GCS verbal subscore is 5. GCS motor subscore is 6.     Comments: Mental Status: Alert, oriented, thought content appropriate, able to give a coherent history. Speech fluent without evidence of aphasia. Able to follow 2 step commands without difficulty. Cranial Nerves: II: Peripheral visual fields grossly normal, pupils equal, round, reactive to light III,IV, VI: ptosis not present, extra-ocular motions intact bilaterally V,VII: smile symmetric, eyebrows raise symmetric, facial light touch sensation equal VIII: hearing grossly normal to voice X: uvula elevates symmetrically XI: bilateral shoulder shrug symmetric and strong XII: midline tongue extension without fassiculations Motor: Normal tone. 5/5 strength in upper and lower extremities bilaterally including strong and equal grip strength and dorsiflexion/plantar flexion Sensory: Sensation intact to light touch in all extremities.Negative Romberg.  Deep Tendon Reflexes: 2+ and symmetric patella Cerebellar: normal finger-to-nose maze with bilateral upper extremities. Normal heel-to -shin balance bilaterally of the lower extremity. No pronator drift.  Gait: normal gait and balance CV: distal pulses palpable throughout  Psychiatric:        Mood and Affect: Mood normal.        Behavior: Behavior is cooperative.    ED  Treatments / Results  Labs (all labs ordered are listed, but only abnormal results are displayed) Labs Reviewed - No data to display  EKG None  Radiology Ct Head Wo Contrast  Result Date: 04/23/2019 CLINICAL DATA:  Headache, muscle fatigue and wheezing upon awakening. EXAM: CT HEAD WITHOUT CONTRAST TECHNIQUE: Contiguous axial images were obtained from the base of the skull through the vertex without intravenous contrast. COMPARISON:  None. FINDINGS: Brain: No evidence of acute infarction, hemorrhage, hydrocephalus, extra-axial collection or mass lesion/mass effect. Normal cerebral volume. No white matter disease. Vascular: No hyperdense vessel or unexpected calcification. Skull: Normal. Negative for fracture or focal lesion. Sinuses/Orbits: No acute finding. Mild mucosal thickening both maxillary sinuses. Mild mucosal thickening BILATERAL ethmoid air cells. No acute orbital findings. Other: None. IMPRESSION: Negative exam. Electronically Signed   By: Elsie StainJohn T Curnes M.D.   On: 04/23/2019 09:18   Dg Chest Portable 1 View  Result Date: 04/23/2019 CLINICAL DATA:  Cough without fever. EXAM: PORTABLE CHEST 1 VIEW COMPARISON:  02/09/2019 FINDINGS: Normal heart size. Faint nodular BILATERAL pulmonary opacities could represent edema or infection. No effusion or pneumothorax. Worsening aeration from priors. IMPRESSION: Faint BILATERAL nodular pulmonary opacities could represent edema or infection. Worsening aeration from priors. Electronically Signed   By: Elsie StainJohn T Curnes M.D.   On: 04/23/2019 08:29    Procedures Procedures (including critical care time)  Medications Ordered in ED Medications  AeroChamber Plus Flo-Vu Large MISC 1 each (has no administration in time range)  albuterol (VENTOLIN HFA) 108 (90 Base) MCG/ACT inhaler 1-2 puff (2 puffs Inhalation Given 04/23/19 0940)  sodium chloride 0.9 % bolus 1,000 mL (1,000 mLs Intravenous New Bag/Given 04/23/19 0849)  acetaminophen (TYLENOL) tablet 650 mg  (650 mg Oral Given 04/23/19 [id number])     Initial Impression / Assessment and Plan / ED Course  I have reviewed the triage vital signs and the nursing notes.  Pertinent labs & imaging results that were available during my care of the patient were reviewed by me and considered in my medical decision making (see chart for details).    38 year old male with viral-like illness x2 days with cough, subjective fever, congestion and body aches additionally with headache worsening since this morning.  Reports seeing spots however no neuro deficits on exam.  Chart review reveals that patient has outpatient CT head planned for history of syncope and collapse as outpatient however he has had difficulty scheduling this due to current pandemic.  Discussed with Dr. Pilar Plate attending, will pursue CT head at this time. - On examination patient is tired appearing however no acute distress.  Heart regular rate and rhythm without murmur, not tachycardic on auscultation, lungs with faint bilateral wheezing present, no respiratory distress.  Abdomen soft nontender and without peritoneal signs.  Neuro examination is without deficit.  No meningeal signs on examination. - Chest x-ray:  IMPRESSION:  Faint BILATERAL nodular pulmonary opacities could represent edema or  infection. Worsening aeration from priors.   CT head:  IMPRESSION:  Negative exam.   Case rediscussed with Dr. Pilar Plate.  Suspect dehydration plays a part of patient's headache and seeing spots.  Fluid bolus given, 650 mg Tylenol given with resolution of patient's headache.  He states he is feeling much improved and is requesting discharge.  I have encouraged patient to continue follow-up as previously scheduled with his cardiologist regarding his prior visits.  Discussion held with patient and he is aware of possibility of COVID-19 infection however due to current protocols testing is not indicated at this time and he states understanding.  I have discussed two-week  quarantine of the patient and until he is asymptomatic for 1 week.  Doxycycline 100 mg twice daily x7 days prescribed for suspected community-acquired pneumonia.  He has also been given a refill of his albuterol inhaler and given a spacer here for his mild wheezing.  Patient is borderline tachycardic at 100 bpm this is improved following fluid bolus have encouraged water rehydration.  Respirations at 16 breaths/min and SPO2 100% on room air.  There is no indication for admission of this patient at this time.  I have discussed return precautions at length with the patient including that of COVID-19/viral illness and his headache.    Additionally I do not suspect ACS, PE, dissection or other acute cardiopulmonary processes for patient's cough and shortness of breath at this time as he is without chest pain, hemoptysis, signs of DVT.  Clinical picture at this time is suggestive of viral illness. Patient was evaluated in the context of the global COVID-19 pandemic, which necessitated consideration that the patient might be at risk for infection with the SARS-CoV-2 virus that causes COVID-19. Institutional protocols and algorithms that pertain to the evaluation of patients at risk for COVID-19 are in a state of rapid change based on information released by regulatory bodies including the CDC and federal and state organizations. These policies and algorithms were followed during the patient's care in the ED. no indication for COVID-19 testing at this time.  On re-evaluation, patient is well appearing.  Patient reports that the occasional spots in his vision have not occurred since ED arrival.  The patient denies any neurologic symptoms focal numbness/weakness, balance problems, confusion, or speech difficulty to suggest a life-threatening intracranial process such as intracranial hemorrhage or mass.  Additionally CT head was negative.  The patient has no clotting risk factors thus venous sinus thrombosis is unlikely.   The patient does have subjective fevers however he was afebrile on arrival to the ED with no previous antipyretic use, he has no neck pain or nuchal rigidity to suggest meningitis. Patient is afebrile, non-toxic and well appearing. Reassuring neuro exam, normal gait around room and back. No cranial deficits, no speech deficits, negative pronator drift, normal/equal strength to all extremities.  - At  this time there does not appear to be any evidence of an acute emergency medical condition and the patient appears stable for discharge with appropriate outpatient follow up. Diagnosis was discussed with patient who verbalizes understanding of care plan and is agreeable to discharge. I have discussed return precautions with patient who verbalizes understanding of return precautions. Patient encouraged to follow-up with their PCP. All questions answered.  Patient has been discharged in good condition.  Patient's case discussed with Dr. Pilar Plate who agrees with plan to discharge with doxycycline and outpatient follow-up.   Note: Portions of this report may have been transcribed using voice recognition software. Every effort was made to ensure accuracy; however, inadvertent computerized transcription errors may still be present. Final Clinical Impressions(s) / ED Diagnoses   Final diagnoses:  Suspected Covid-19 Virus Infection  Community acquired pneumonia of left lung, unspecified part of lung  Community acquired pneumonia of right lung, unspecified part of lung  Nonintractable headache, unspecified chronicity pattern, unspecified headache type    ED Discharge Orders         Ordered    doxycycline (VIBRAMYCIN) 100 MG capsule  2 times daily     04/23/19 1003           Elizabeth Palau 04/23/19 1040    Sabas Sous, MD 04/24/19 [phone]

## 2019-07-28 ENCOUNTER — Ambulatory Visit: Payer: Self-pay | Admitting: Family Medicine

## 2019-08-24 ENCOUNTER — Other Ambulatory Visit: Payer: Self-pay

## 2019-08-24 ENCOUNTER — Encounter: Payer: Self-pay | Admitting: Family Medicine

## 2019-08-24 ENCOUNTER — Ambulatory Visit: Payer: Self-pay | Attending: Family Medicine | Admitting: Family Medicine

## 2019-08-24 VITALS — BP 132/77 | HR 82 | Temp 98.2°F | Ht 73.0 in | Wt 321.8 lb

## 2019-08-24 DIAGNOSIS — Z6841 Body Mass Index (BMI) 40.0 and over, adult: Secondary | ICD-10-CM

## 2019-08-24 DIAGNOSIS — Z8709 Personal history of other diseases of the respiratory system: Secondary | ICD-10-CM

## 2019-08-24 DIAGNOSIS — Z23 Encounter for immunization: Secondary | ICD-10-CM

## 2019-08-24 DIAGNOSIS — Z833 Family history of diabetes mellitus: Secondary | ICD-10-CM

## 2019-08-24 LAB — POCT GLYCOSYLATED HEMOGLOBIN (HGB A1C): Hemoglobin A1C: 5.5 % (ref 4.0–5.6)

## 2019-08-24 NOTE — Patient Instructions (Signed)

## 2019-08-24 NOTE — Progress Notes (Signed)
New Patient Office Visit  Subjective:  Patient ID: Jacob Olsen, male    DOB: 03/11/1981  Age: 38 y.o. MRN: [id number]  CC:  Chief Complaint  Patient presents with  . New Patient (Initial Visit)  . Establish Care    HPI Jacob Olsen presents to establish care.  He denies any current significant medical issues.  He reports a history of asthma but has had no issues with his asthma since childhood.  Patient does not currently have any factors that tend to trigger shortness of breath, cough or wheezing.  Patient did have an episode back in April of fainting while at work but patient states that he works outside and it was very hot.  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  He states that he was told that he actually appeared to pass out for a few seconds.  He was seen and treated at the emergency department and was diagnosed with dehydration.  Patient states that he now make sure that he drinks plenty of fluids both before and after work in order to remain well-hydrated and he has had no additional syncopal episodes but on very hot days he has felt slightly lightheaded if he has been bending over and then stands up.  Otherwise he reports no issues.  He does have a family history significant for diabetes.  He denies any increased thirst and no urinary frequency other than from drinking more fluids since his syncopal episode.        He denies any current or recent issues with headaches or dizziness, no chest pain or palpitations, no shortness of breath, coughing or wheezing.  He does not avoid activities due to a fear of asthma exacerbation.  He has had no hospitalizations as an adult related to asthma.    Past Medical History:  Diagnosis Date  . Asthma   . Syncope and collapse     Past Surgical History:  Procedure Laterality Date  . NO PAST SURGERIES      Family History  Problem Relation Age of Onset  . Diabetes  Mother   . Hypertension Mother   . Pancreatic cancer Paternal Grandmother     Social History   Tobacco Use  . Smoking status: Never Smoker  . Smokeless tobacco: Never Used  Substance Use Topics  . Alcohol use: Not Currently  . Drug use: Never    ROS Review of Systems  Constitutional: Negative for chills, fatigue and fever.  HENT: Negative for sore throat and trouble swallowing.   Eyes: Negative for photophobia and visual disturbance.  Respiratory: Negative for cough and shortness of breath.   Cardiovascular: Negative for chest pain, palpitations and leg swelling.  Gastrointestinal: Negative for abdominal pain, constipation, diarrhea and nausea.  Endocrine: Negative for polydipsia, polyphagia and polyuria.  Genitourinary: Negative for dysuria and frequency.  Musculoskeletal: Negative for arthralgias, gait problem and joint swelling.  Neurological: Negative for dizziness, light-headedness and headaches.  Hematological: Negative for adenopathy. Does not bruise/bleed easily.    Objective:   Today's Vitals: BP 132/77 (BP Location: Left Arm, Patient Position: Sitting, Cuff Size: Large)   Pulse 82   Temp 98.2 F (36.8 C) (Oral)   Ht 6\' 1"  (1.854 m)   Wt (!) 321 lb 12.8 oz (146 kg)   SpO2 96%   BMI 42.46 kg/m   Physical Exam Vitals signs and nursing note reviewed.  Constitutional:  Appearance: He is obese.     Comments: Well-nourished well-developed and larger framed obese male in no acute distress; patient is wearing a facemask as for office COVID-19 precautions  Neck:     Musculoskeletal: Normal range of motion and neck supple. No muscular tenderness.     Vascular: No carotid bruit.  Cardiovascular:     Rate and Rhythm: Normal rate and regular rhythm.  Pulmonary:     Effort: Pulmonary effort is normal.     Breath sounds: Normal breath sounds.  Abdominal:     Palpations: Abdomen is soft.     Tenderness: There is no abdominal tenderness. There is no right CVA  tenderness, left CVA tenderness, guarding or rebound.     Comments: Truncal obesity  Musculoskeletal:        General: No swelling, tenderness or deformity.     Right lower leg: No edema.     Left lower leg: No edema.  Lymphadenopathy:     Cervical: No cervical adenopathy.  Skin:    General: Skin is warm and dry.  Neurological:     General: No focal deficit present.     Mental Status: He is alert and oriented to person, place, and time.  Psychiatric:        Mood and Affect: Mood normal.        Behavior: Behavior normal.        Thought Content: Thought content normal.     Assessment & Plan:  1. History of asthma Patient reports history of asthma but has had no significant issues since childhood.  Patient declines prescription for albuterol inhaler at today's visit but does wish to have influenza immunization.  2. Family history of diabetes mellitus (DM) Patient with morbid obesity and family history of diabetes.  Patient will have hemoglobin A1c at today's visit and will be notified of the results.  Patient is encouraged to follow a low carbohydrate diet and begin regular exercise with goal of weight loss to help reduce his risk of becoming diabetic - HgB A1c  3. Need for immunization against influenza Patient was offered and agreed to have influenza immunization at today's visit. - Flu Vaccine QUAD 6+ mos PF IM (Fluarix Quad PF)  4. Morbid obesity with BMI of 40.0-44.9, adult (HCC) Discussed low fat/low carbohydrate diet along with regular exercise as tolerated with goal of weight loss to help with his current issues with morbid obesity.  Weight loss will also help reduce patient's risk of becoming diabetic.   Outpatient Encounter Medications as of 08/24/2019  Medication Sig  . naproxen (NAPROSYN) 500 MG tablet Take 1 tablet (500 mg total) by mouth 2 (two) times daily. (Patient not taking: Reported on 08/24/2019)   No facility-administered encounter medications on file as of  08/24/2019.    An After Visit Summary was printed and given to the patient.  Follow-up: Return if symptoms worsen or fail to improve, for annual well exam at your convenience.   Cain Saupeammie Juanjose Mojica, MD

## 2019-09-02 IMAGING — CT CT HEAD WITHOUT CONTRAST
4 series · 15 of 47 positions shown, 17 images · non-contrast
Comparison: None.

CLINICAL DATA: Headache, muscle fatigue and wheezing upon
awakening.

EXAM:
CT HEAD WITHOUT CONTRAST
TECHNIQUE: Contiguous axial images were obtained from the base of the skull
through the vertex without intravenous contrast.

[Series 3: head without · axial · non-contrast · 0.45mm/px · z∈[-66,+64]mm · 7 of 36 slices shown, 9 images]
[im 5/36  brain]
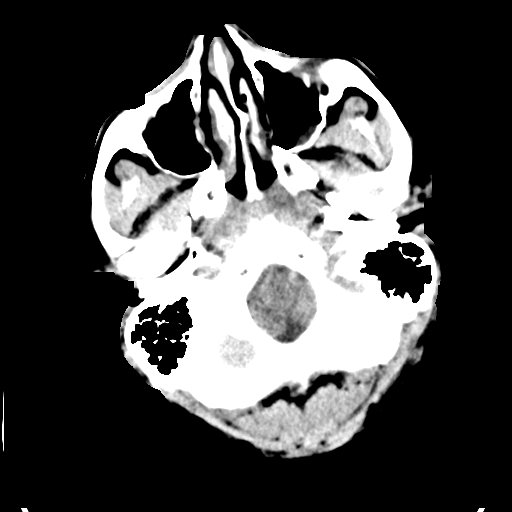
[im 5/36  bone]
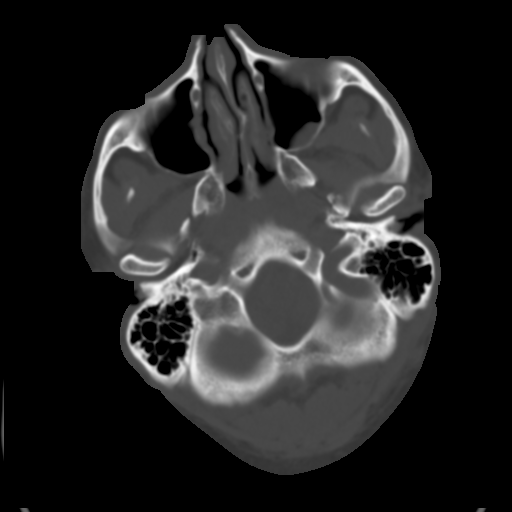
[im 9/36  brain]
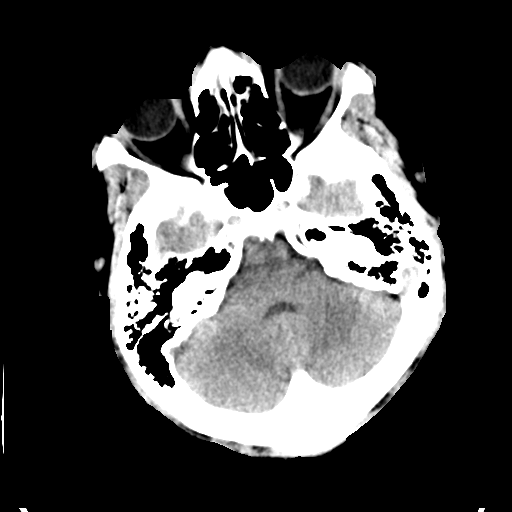
[im 14/36  brain]
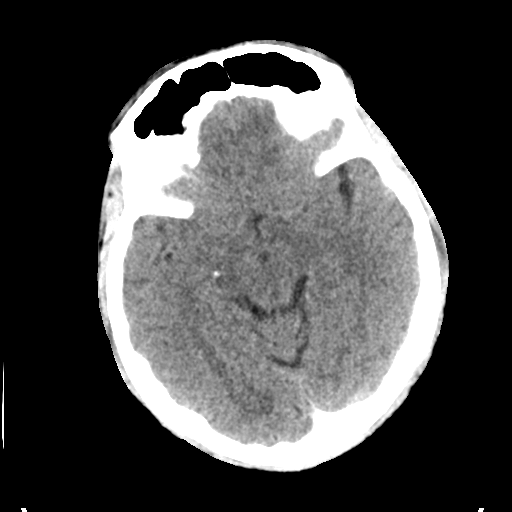
[im 18/36  brain]
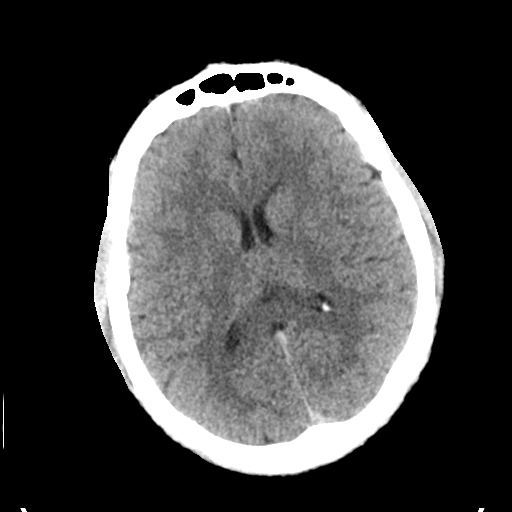
[im 22/36  brain]
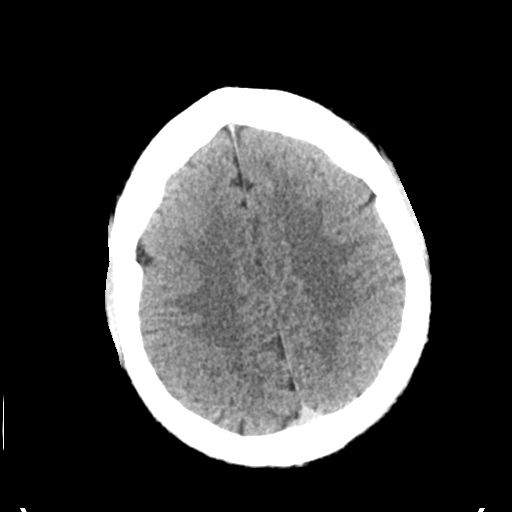
[im 22/36  bone]
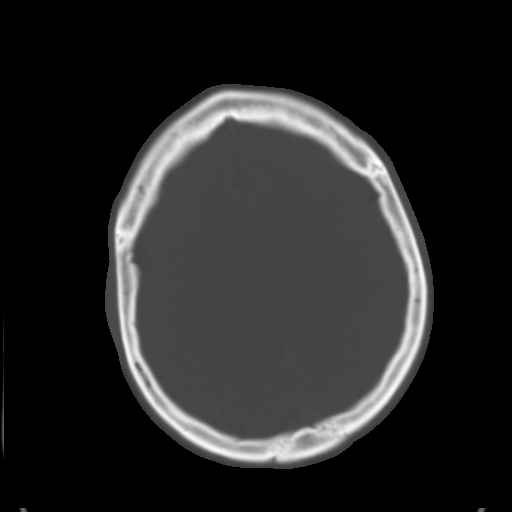
[im 27/36  brain]
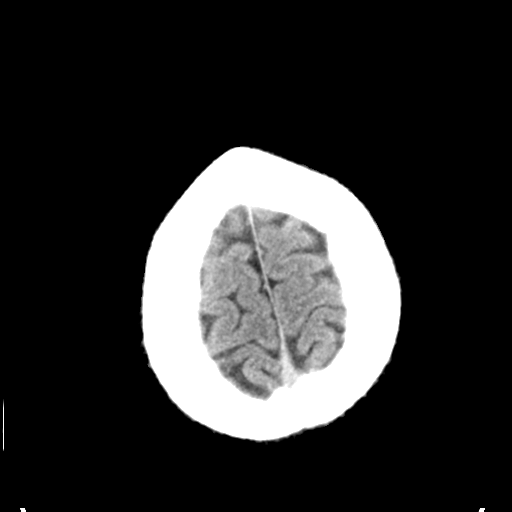
[im 31/36  brain]
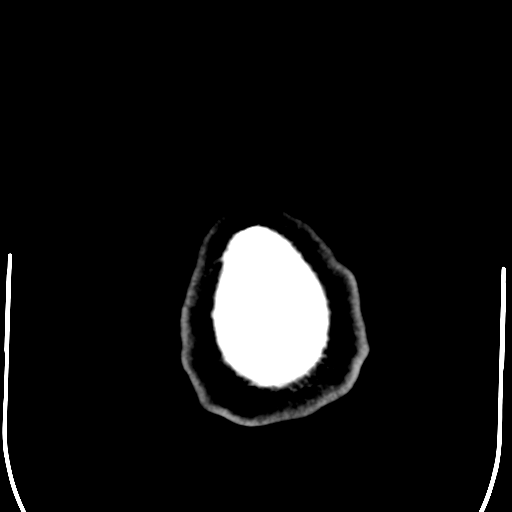

[Series 4: head bone · axial · 0.45mm/px · z∈[-70,-52]mm · 2 of 90 slices shown]
[im 9/90  bone]
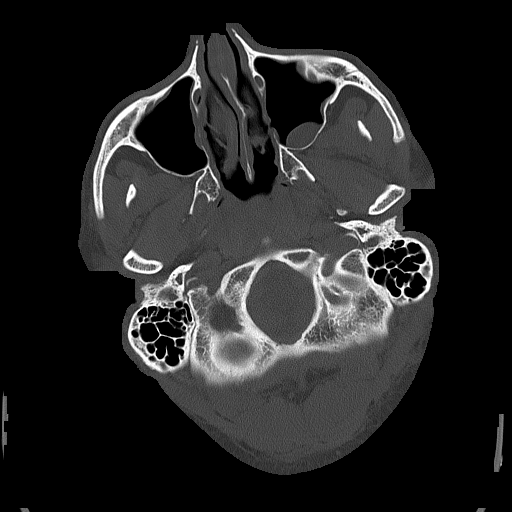
[im 18/90  bone]
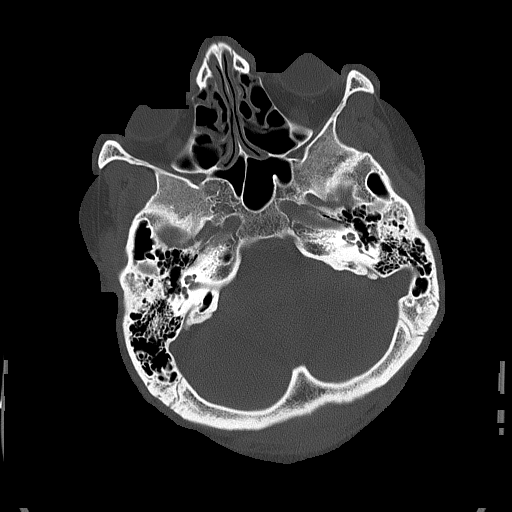

[Series 5: head without cor · coronal · non-contrast · 0.30mm/px · 3 of 78 slices shown]
[im 26/78  brain]
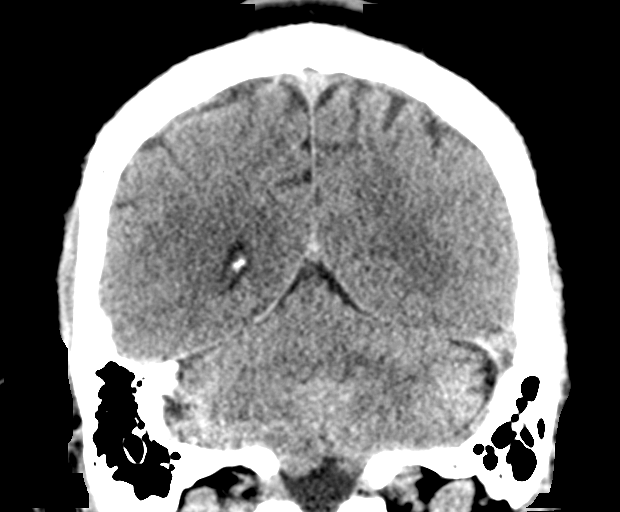
[im 35/78  brain]
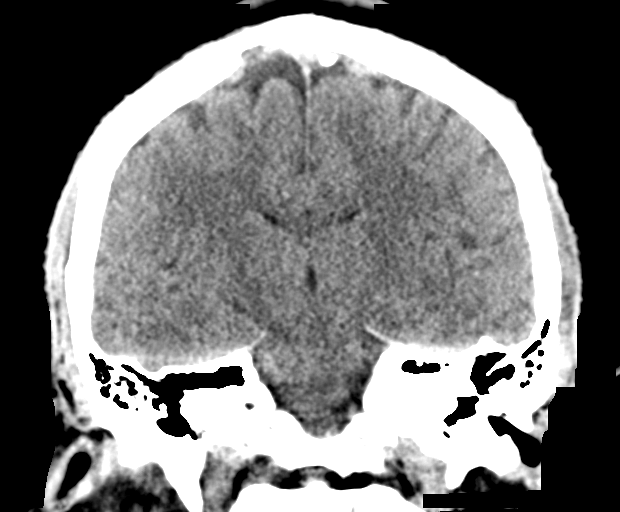
[im 43/78  brain]
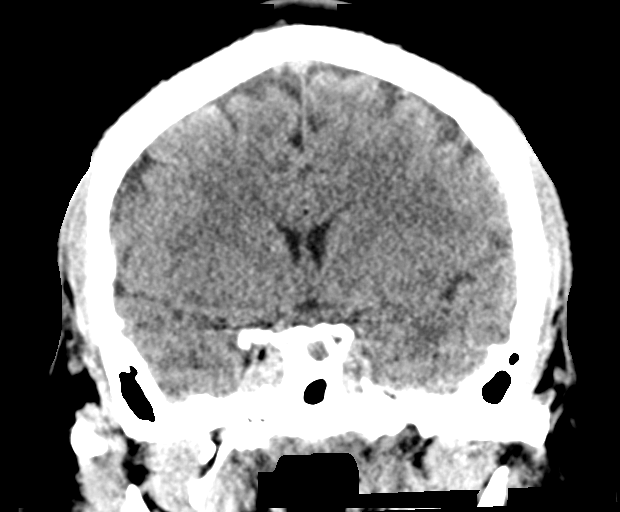

[Series 6: head without sag · sagittal · non-contrast · 0.30mm/px · 3 of 67 slices shown]
[im 23/67  brain]
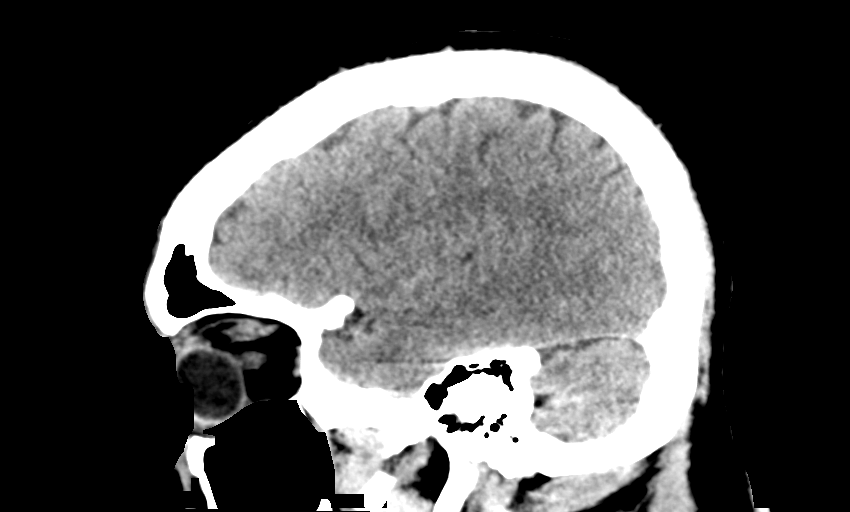
[im 34/67  brain]
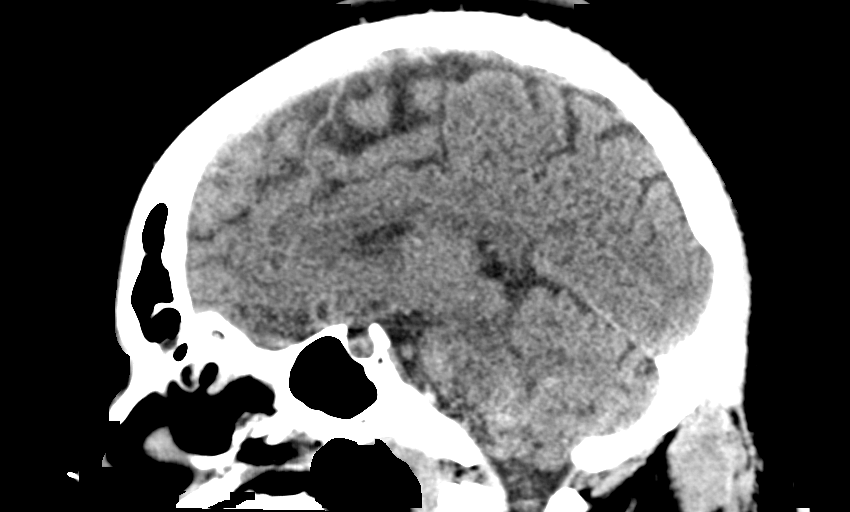
[im 45/67  brain]
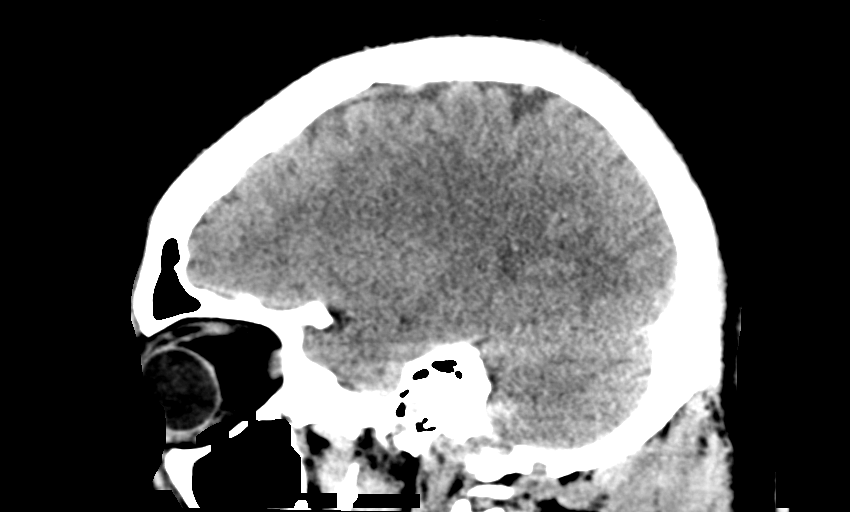

[15 of 47 positions shown; findings below may reference images not displayed]

FINDINGS: Brain: No evidence of acute infarction, hemorrhage, hydrocephalus,
extra-axial collection or mass lesion/mass effect. Normal cerebral
volume. No white matter disease.

Vascular: No hyperdense vessel or unexpected calcification.

Skull: Normal. Negative for fracture or focal lesion.

Sinuses/Orbits: No acute finding. Mild mucosal thickening both
maxillary sinuses. Mild mucosal thickening BILATERAL ethmoid air
cells. No acute orbital findings.

Other: None.
IMPRESSION: Negative exam.

## 2019-09-16 ENCOUNTER — Telehealth (HOSPITAL_COMMUNITY): Payer: Self-pay

## 2019-09-16 NOTE — Telephone Encounter (Signed)
New message   Just an FYI. We have made several attempts to contact this patient including sending a letter to schedule or reschedule their echocardiogram. We will be removing the patient from the echo WQ.  9.10.20 mail reminder letter Zelda Reames  8.24.20 @ 2:41pm unable to leave a vm Vali Capano 7.27.20 @ 3:39pm lm with family member to call the office back Shulem Mader COVID-19

## 2019-12-20 ENCOUNTER — Telehealth: Payer: Self-pay | Admitting: Radiology

## 2019-12-20 NOTE — Telephone Encounter (Signed)
Yes please schedule. 

## 2019-12-20 NOTE — Telephone Encounter (Signed)
Exercise treadmill test was ordered back in April 2020 when treadmill room was shut down due to Covid. Please advise if patient still needs test scheduled.  If no longer needed please cancel the order   

## 2022-06-10 ENCOUNTER — Other Ambulatory Visit: Payer: Self-pay

## 2022-06-10 ENCOUNTER — Emergency Department
Admission: EM | Admit: 2022-06-10 | Discharge: 2022-06-10 | Disposition: A | Discharge status: Home / Self Care | Payer: 59 | Attending: Student in an Organized Health Care Education/Training Program | Admitting: Student in an Organized Health Care Education/Training Program

## 2022-06-10 DIAGNOSIS — J45909 Unspecified asthma, uncomplicated: Secondary | ICD-10-CM | POA: Diagnosis not present

## 2022-06-10 DIAGNOSIS — R197 Diarrhea, unspecified: Secondary | ICD-10-CM | POA: Diagnosis present

## 2022-06-10 DIAGNOSIS — R109 Unspecified abdominal pain: Secondary | ICD-10-CM | POA: Diagnosis not present

## 2022-06-10 DIAGNOSIS — R112 Nausea with vomiting, unspecified: Secondary | ICD-10-CM | POA: Insufficient documentation

## 2022-06-10 LAB — URINALYSIS, ROUTINE W REFLEX MICROSCOPIC
Bacteria, UA: NONE SEEN
Bilirubin Urine: NEGATIVE
Glucose, UA: NEGATIVE mg/dL
Ketones, ur: 5 mg/dL — AB
Leukocytes,Ua: NEGATIVE
Nitrite: NEGATIVE
Protein, ur: NEGATIVE mg/dL
Specific Gravity, Urine: 1.026 (ref 1.005–1.030)
pH: 5 (ref 5.0–8.0)

## 2022-06-10 LAB — CBC
HCT: 48 % (ref 39.0–52.0)
Hemoglobin: 15.7 g/dL (ref 13.0–17.0)
MCH: 28.9 pg (ref 26.0–34.0)
MCHC: 32.7 g/dL (ref 30.0–36.0)
MCV: 88.4 fL (ref 80.0–100.0)
Platelets: 242 10*3/uL (ref 150–400)
RBC: 5.43 MIL/uL (ref 4.22–5.81)
RDW: 12.8 % (ref 11.5–15.5)
WBC: 5.7 10*3/uL (ref 4.0–10.5)
nRBC: 0 % (ref 0.0–0.2)

## 2022-06-10 LAB — COMPREHENSIVE METABOLIC PANEL
ALT: 32 U/L (ref 0–44)
AST: 23 U/L (ref 15–41)
Albumin: 4.1 g/dL (ref 3.5–5.0)
Alkaline Phosphatase: 56 U/L (ref 38–126)
Anion gap: 7 (ref 5–15)
BUN: 11 mg/dL (ref 6–20)
CO2: 23 mmol/L (ref 22–32)
Calcium: 9 mg/dL (ref 8.9–10.3)
Chloride: 108 mmol/L (ref 98–111)
Creatinine, Ser: 0.95 mg/dL (ref 0.61–1.24)
GFR, Estimated: >60 mL/min (ref >60)
Glucose, Bld: 95 mg/dL (ref 70–99)
Potassium: 3.6 mmol/L (ref 3.5–5.1)
Sodium: 138 mmol/L (ref 135–145)
Total Bilirubin: 1.2 mg/dL (ref 0.3–1.2)
Total Protein: 7.8 g/dL (ref 6.5–8.1)

## 2022-06-10 LAB — LIPASE, BLOOD: Lipase: 33 U/L (ref 11–51)

## 2022-06-10 MED ORDER — ONDANSETRON HCL 4 MG/2ML IJ SOLN
4.0000 mg | Freq: Once | INTRAMUSCULAR | Status: AC
  Administered 2022-06-10: 4 mg via INTRAVENOUS
  Filled 2022-06-10: qty 2

## 2022-06-10 MED ORDER — ONDANSETRON 4 MG PO TBDP: 4.0000 mg | ORAL_TABLET | Freq: Three times a day (TID) | ORAL | 0 refills | Status: AC | PRN

## 2022-06-10 MED ORDER — SODIUM CHLORIDE 0.9 % IV BOLUS
1000.0000 mL | Freq: Once | INTRAVENOUS | Status: AC
  Administered 2022-06-10: 1000 mL via INTRAVENOUS

## 2022-06-10 NOTE — ED Provider Notes (Signed)
[facility] Provider Note    Event Date/Time   First MD Initiated Contact with Patient 06/10/22 [phone]     (approximate)   History   Diarrhea   HPI  Jacob Olsen is a 41 y.o. male presenting to the emergency department for treatment and evaluation of diarrhea and vomiting for the past 2 days. He estimates 5 episodes of vomiting and multiple diarrhea. Is also having some abdominal pain and cramping. No fever.  Past Medical History:  Diagnosis Date   Asthma    Syncope and collapse      Physical Exam   Triage Vital Signs: ED Triage Vitals [06/10/22 0908]  Enc Vitals Group     BP 123/87     Pulse 81     Resp 18     Temp 97.8     Temp src      SpO2 96     Weight      Height      Head Circumference      Peak Flow      Pain Score 8     Pain Loc      Pain Edu?      Excl. in GC?     Most recent vital signs: Vitals:   06/10/22 0911  BP: 123/87  Pulse: 81  Resp: 18  Temp: 97.8 F (36.6 C)  SpO2: 96%    General: Awake, no distress.  CV:  Good peripheral perfusion.  Resp:  Normal effort.  Abd:  No distention.  No focal abdominal tenderness.  Hyperactive bowel sounds in all quadrants. Other:     ED Results / Procedures / Treatments   Labs (all labs ordered are listed, but only abnormal results are displayed) Labs Reviewed  LIPASE, BLOOD  COMPREHENSIVE METABOLIC PANEL  CBC  URINALYSIS, ROUTINE W REFLEX MICROSCOPIC     EKG  Not indicated   RADIOLOGY  Not indicated  I have independently reviewed and interpreted imaging as well as reviewed report from radiology.  PROCEDURES:  Critical Care performed: No  Procedures   MEDICATIONS ORDERED IN ED:  Medications  sodium chloride 0.9 % bolus 1,000 mL (1,000 mLs Intravenous New Bag/Given 06/10/22 0945)  ondansetron (ZOFRAN) injection 4 mg (4 mg Intravenous Given 06/10/22 0945)     IMPRESSION / MDM / ASSESSMENT AND PLAN / ED COURSE   I reviewed the triage vital signs  and the nursing notes.  Differential diagnosis includes, but is not limited to: Gastroenteritis, colitis, ileus, bowel obstruction  Patient's presentation is most consistent with acute presentation with potential threat to life or bodily function.  41 year old male presenting to the emergency department for treatment and evaluation of abdominal pain with nausea, vomiting, and diarrhea.  See HPI for further details.  Plan will be to get labs and give him some Zofran and IV fluids.  Labs are reassuring.  Normal white cell count, no electrolyte disturbance, normal lipase.  Patient feels better after IV fluids and Zofran.  Able to tolerate p.o. fluids.  Plan will be to discharge him home with a prescription for Zofran and have him remain on a clear liquid diet today and progress as tolerated to his normal diet.  He is to follow-up with his primary care provider or return to the emergency department if symptoms or not improving over the next few days or if symptoms get worse.     FINAL CLINICAL IMPRESSION(S) / ED DIAGNOSES   Final diagnoses:  Nausea vomiting and diarrhea  Rx / DC Orders   ED Discharge Orders          Ordered    ondansetron (ZOFRAN-ODT) 4 MG disintegrating tablet  Every 8 hours PRN        06/10/22 1043             Note:  This document was prepared using Dragon voice recognition software and may include unintentional dictation errors.   Chinita Pester, FNP 06/10/22 1112    Willy Eddy, MD 06/10/22 1202

## 2022-06-10 NOTE — ED Triage Notes (Addendum)
Pt comes with c/o diarrhea and vomiting. Pt states this all started two days ago. Pt states some belly pain as well. Pt states 8/10 pain.
